# Patient Record
Sex: Male | Born: 1975 | Race: White | Hispanic: Yes | Marital: Single | Smoking: Former smoker
Health system: Southern US, Community
[De-identification: ages and names within clinical notes are randomized; demographics above are authoritative.]

## PROBLEM LIST (undated history)

## (undated) DIAGNOSIS — J039 Acute tonsillitis, unspecified: Secondary | ICD-10-CM

## (undated) DIAGNOSIS — N200 Calculus of kidney: Secondary | ICD-10-CM

---

## 1999-11-22 ENCOUNTER — Emergency Department (HOSPITAL_COMMUNITY): Admission: EM | Admit: 1999-11-22 | Discharge: 1999-11-22 | Payer: Self-pay | Admitting: Emergency Medicine

## 1999-12-03 ENCOUNTER — Emergency Department (HOSPITAL_COMMUNITY): Admission: EM | Admit: 1999-12-03 | Discharge: 1999-12-03 | Payer: Self-pay | Admitting: Emergency Medicine

## 2004-11-28 ENCOUNTER — Emergency Department (HOSPITAL_COMMUNITY): Admission: EM | Admit: 2004-11-28 | Discharge: 2004-11-28 | Payer: Self-pay | Admitting: Emergency Medicine

## 2006-03-16 ENCOUNTER — Emergency Department (HOSPITAL_COMMUNITY): Admission: EM | Admit: 2006-03-16 | Discharge: 2006-03-16 | Payer: Self-pay | Admitting: Family Medicine

## 2006-05-03 ENCOUNTER — Emergency Department (HOSPITAL_COMMUNITY): Admission: EM | Admit: 2006-05-03 | Discharge: 2006-05-03 | Payer: Self-pay | Admitting: Family Medicine

## 2006-05-26 ENCOUNTER — Emergency Department (HOSPITAL_COMMUNITY): Admission: EM | Admit: 2006-05-26 | Discharge: 2006-05-26 | Payer: Self-pay | Admitting: Family Medicine

## 2006-07-12 ENCOUNTER — Emergency Department (HOSPITAL_COMMUNITY): Admission: EM | Admit: 2006-07-12 | Discharge: 2006-07-12 | Payer: Self-pay | Admitting: Emergency Medicine

## 2008-05-18 ENCOUNTER — Emergency Department (HOSPITAL_COMMUNITY): Admission: EM | Admit: 2008-05-18 | Discharge: 2008-05-18 | Payer: Self-pay | Admitting: Emergency Medicine

## 2008-07-04 ENCOUNTER — Emergency Department (HOSPITAL_COMMUNITY): Admission: EM | Admit: 2008-07-04 | Discharge: 2008-07-05 | Payer: Self-pay | Admitting: Emergency Medicine

## 2008-11-29 ENCOUNTER — Emergency Department (HOSPITAL_COMMUNITY): Admission: EM | Admit: 2008-11-29 | Discharge: 2008-11-29 | Payer: Self-pay | Admitting: Family Medicine

## 2010-06-19 LAB — GC/CHLAMYDIA PROBE AMP, GENITAL: Chlamydia, DNA Probe: NEGATIVE

## 2010-06-24 LAB — DIFFERENTIAL
Basophils Absolute: 0.1 10*3/uL (ref 0.0–0.1)
Basophils Relative: 1 % (ref 0–1)
Eosinophils Absolute: 0.5 10*3/uL (ref 0.0–0.7)
Eosinophils Relative: 7 % — ABNORMAL HIGH (ref 0–5)
Neutrophils Relative %: 62 % (ref 43–77)

## 2010-06-24 LAB — COMPREHENSIVE METABOLIC PANEL
BUN: 10 mg/dL (ref 6–23)
CO2: 22 mEq/L (ref 19–32)
Calcium: 9.7 mg/dL (ref 8.4–10.5)
Chloride: 108 mEq/L (ref 96–112)
GFR calc Af Amer: >60 mL/min (ref >60)
GFR calc non Af Amer: >60 mL/min (ref >60)
Glucose, Bld: 88 mg/dL (ref 70–99)
Total Protein: 7 g/dL (ref 6.0–8.3)

## 2010-06-24 LAB — CBC
HCT: 47.8 % (ref 39.0–52.0)
Hemoglobin: 16.3 g/dL (ref 13.0–17.0)
MCHC: 34.1 g/dL (ref 30.0–36.0)
MCV: 86.4 fL (ref 78.0–100.0)
RBC: 5.53 MIL/uL (ref 4.22–5.81)
RDW: 14 % (ref 11.5–15.5)

## 2010-06-24 LAB — URINALYSIS, ROUTINE W REFLEX MICROSCOPIC
Ketones, ur: NEGATIVE mg/dL
Specific Gravity, Urine: 1.014 (ref 1.005–1.030)

## 2010-06-24 LAB — ACETAMINOPHEN LEVEL
Acetaminophen (Tylenol), Serum: 120.8 ug/mL — ABNORMAL HIGH (ref 10–30)
Acetaminophen (Tylenol), Serum: 78.5 ug/mL — ABNORMAL HIGH (ref 10–30)

## 2010-06-24 LAB — SALICYLATE LEVEL: Salicylate Lvl: <4 mg/dL (ref 2.8–20.0)

## 2010-06-24 LAB — RAPID URINE DRUG SCREEN, HOSP PERFORMED
Amphetamines: NOT DETECTED
Barbiturates: NOT DETECTED

## 2010-06-24 LAB — APTT: aPTT: 36 seconds (ref 24–37)

## 2010-06-24 LAB — ETHANOL: Alcohol, Ethyl (B): 40 mg/dL — ABNORMAL HIGH (ref 0–10)

## 2020-08-23 ENCOUNTER — Encounter (HOSPITAL_COMMUNITY): Payer: Self-pay | Admitting: Emergency Medicine

## 2020-08-23 ENCOUNTER — Ambulatory Visit (HOSPITAL_COMMUNITY)
Admission: EM | Admit: 2020-08-23 | Discharge: 2020-08-23 | Disposition: A | Discharge status: Home / Self Care | Payer: BC Managed Care – PPO | Attending: Medical Oncology | Admitting: Medical Oncology

## 2020-08-23 DIAGNOSIS — M549 Dorsalgia, unspecified: Secondary | ICD-10-CM | POA: Diagnosis present

## 2020-08-23 DIAGNOSIS — R310 Gross hematuria: Secondary | ICD-10-CM | POA: Diagnosis present

## 2020-08-23 LAB — POCT URINALYSIS DIPSTICK, ED / UC
Bilirubin Urine: NEGATIVE
Glucose, UA: NEGATIVE mg/dL
Ketones, ur: NEGATIVE mg/dL
Nitrite: NEGATIVE
Protein, ur: 100 mg/dL — AB
Specific Gravity, Urine: 1.01 (ref 1.005–1.030)
Urobilinogen, UA: 0.2 mg/dL (ref 0.0–1.0)
pH: 7 (ref 5.0–8.0)

## 2020-08-23 MED ORDER — TAMSULOSIN HCL 0.4 MG PO CAPS: 0.4000 mg | ORAL_CAPSULE | Freq: Every day | ORAL | 0 refills | Status: AC

## 2020-08-23 NOTE — ED Provider Notes (Signed)
[facility]    CSN: [id number] Arrival date & time: 08/23/20  1302      History   Chief Complaint Chief Complaint  Patient presents with   Hematuria    HPI MAURION WALKOWIAK is a 45 y.o. male.   HPI  Hematuria: Pt reports that he has had hematuria for the past 1 day.  He has had a little bit of right flank to back pain in addition to a little bit of urinary urgency and pressure.  He is not having any trouble with urine stream at the moment.  He denies any vomiting, abdominal pain, fever.  No history of UTI or kidney stones.  He has tried hydration for symptoms with some relief. NO history of bleeding disorder or anticoagulation use.   History reviewed. No pertinent past medical history.  There are no problems to display for this patient.   History reviewed. No pertinent surgical history.   Home Medications    Prior to Admission medications   Not on File    Family History History reviewed. No pertinent family history.  Social History     Allergies   Patient has no allergy information on record.   Review of Systems Review of Systems  As stated above in HPI Physical Exam Triage Vital Signs ED Triage Vitals  Enc Vitals Group     BP 08/23/20 1317 (!) 176/100     Pulse Rate 08/23/20 1317 (!) 55     Resp 08/23/20 1317 16     Temp 08/23/20 1317 98.6 F (37 C)     Temp Source 08/23/20 1317 Oral     SpO2 08/23/20 1317 100 %     Weight --      Height --      Head Circumference --      Peak Flow --      Pain Score 08/23/20 1319 0     Pain Loc --      Pain Edu? --      Excl. in GC? --    No data found.  Updated Vital Signs BP (!) 176/100 (BP Location: Right Arm)   Pulse (!) 55   Temp 98.6 F (37 C) (Oral)   Resp 16   SpO2 100%    Physical Exam Vitals and nursing note reviewed.  Constitutional:      General: He is not in acute distress.    Appearance: Normal appearance. He is not ill-appearing, toxic-appearing or diaphoretic.  HENT:      Head: Normocephalic and atraumatic.  Eyes:     Comments: NO pallor  Cardiovascular:     Rate and Rhythm: Normal rate and regular rhythm.     Heart sounds: Normal heart sounds.  Pulmonary:     Effort: Pulmonary effort is normal.     Breath sounds: Normal breath sounds.  Abdominal:     General: Bowel sounds are normal. There is no distension.     Palpations: Abdomen is soft. There is no mass.     Tenderness: There is no abdominal tenderness. There is no right CVA tenderness, left CVA tenderness, guarding or rebound.     Hernia: No hernia is present.  Musculoskeletal:     Cervical back: Neck supple.  Lymphadenopathy:     Cervical: No cervical adenopathy.  Skin:    General: Skin is warm.  Neurological:     Mental Status: He is alert and oriented to person, place, and time.     UC Treatments /  Results  Labs (all labs ordered are listed, but only abnormal results are displayed) Labs Reviewed - No data to display  EKG   Radiology No results found.  Procedures Procedures (including critical care time)  Medications Ordered in UC Medications - No data to display  Initial Impression / Assessment and Plan / UC Course  I have reviewed the triage vital signs and the nursing notes.  Pertinent labs & imaging results that were available during my care of the patient were reviewed by me and considered in my medical decision making (see chart for details).    New.  Likely nephrolithiasis which I discussed with patient.  For now starting on Flomax and having him hydrate with water.  Discussed KUB which she declines at this time.  We also discussed ultrasound in the ER should symptoms worsen or fail to resolve.  We discussed that the Flomax medication helps to open up the tubes to allow kidney stones to pass more easily.  Symptoms can improve within a few hours to 7 days.  We discussed red flag signs and symptoms that would warrant for him to go to the emergency room or urology.  In the  meantime he will also increase his iron intake. Final Clinical Impressions(s) / UC Diagnoses   Final diagnoses:  None   Discharge Instructions   None    ED Prescriptions   None    PDMP not reviewed this encounter.   Rushie Chestnut, [place] 08/23/20 1408

## 2020-08-23 NOTE — ED Triage Notes (Addendum)
Pt said yesterday had blood in urine with no pain. No urination issue. Did have back pain.

## 2020-08-24 LAB — URINE CULTURE: Culture: NO GROWTH

## 2021-05-13 ENCOUNTER — Ambulatory Visit (INDEPENDENT_AMBULATORY_CARE_PROVIDER_SITE_OTHER): Payer: BC Managed Care – PPO

## 2021-05-13 ENCOUNTER — Encounter (HOSPITAL_COMMUNITY): Payer: Self-pay | Admitting: Physician Assistant

## 2021-05-13 ENCOUNTER — Ambulatory Visit (HOSPITAL_COMMUNITY)
Admission: EM | Admit: 2021-05-13 | Discharge: 2021-05-13 | Disposition: A | Discharge status: Home / Self Care | Payer: BC Managed Care – PPO | Attending: Physician Assistant | Admitting: Physician Assistant

## 2021-05-13 ENCOUNTER — Other Ambulatory Visit: Payer: Self-pay

## 2021-05-13 DIAGNOSIS — M542 Cervicalgia: Secondary | ICD-10-CM

## 2021-05-13 DIAGNOSIS — M545 Low back pain, unspecified: Secondary | ICD-10-CM

## 2021-05-13 MED ORDER — METHOCARBAMOL 500 MG PO TABS: 500.0000 mg | ORAL_TABLET | Freq: Two times a day (BID) | ORAL | 0 refills | Status: AC

## 2021-05-13 MED ORDER — PREDNISONE 20 MG PO TABS: 40.0000 mg | ORAL_TABLET | Freq: Every day | ORAL | 0 refills | Status: AC
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
## 2021-06-29 ENCOUNTER — Ambulatory Visit: Payer: BC Managed Care – PPO | Admitting: Internal Medicine

## 2021-09-14 ENCOUNTER — Ambulatory Visit (HOSPITAL_COMMUNITY)
Admission: EM | Admit: 2021-09-14 | Discharge: 2021-09-14 | Disposition: A | Discharge status: Home / Self Care | Payer: 59 | Attending: Physician Assistant | Admitting: Physician Assistant

## 2021-09-14 ENCOUNTER — Encounter (HOSPITAL_COMMUNITY): Payer: Self-pay | Admitting: *Deleted

## 2021-09-14 DIAGNOSIS — J020 Streptococcal pharyngitis: Secondary | ICD-10-CM | POA: Insufficient documentation

## 2021-09-14 DIAGNOSIS — R509 Fever, unspecified: Secondary | ICD-10-CM | POA: Diagnosis not present

## 2021-09-14 HISTORY — DX: Acute tonsillitis, unspecified: J03.90

## 2021-09-14 HISTORY — DX: Calculus of kidney: N20.0

## 2021-09-14 LAB — POCT RAPID STREP A, ED / UC: Streptococcus, Group A Screen (Direct): NEGATIVE

## 2021-09-14 NOTE — ED Triage Notes (Signed)
Reports having a virtual doctor's visit yesterday and was given Augmentin Rx -- pt feels the abx doesn't work for him. C/O chills, fevers up to 102, sore throat onset 3 days ago. Describes tonsillar exudate.

## 2021-09-14 NOTE — Discharge Instructions (Addendum)
Advised to continue the Augmentin until completed. Use frequent salt water gargles and lozenges to help soothe the sore throat. Advised to use Tylenol or Motrin to help reduce the fever and the pain. Follow-up with PCP or return to urgent care if symptoms fail to improve.

## 2021-09-14 NOTE — ED Provider Notes (Signed)
[facility]    CSN: [id number] Arrival date & time: 09/14/21  1039      History   Chief Complaint Chief Complaint  Patient presents with   Sore Throat    HPI Ricky Knight is a 46 y.o. male.   46 year old male presents with sore throat and fever.  Patient relates for the past 2 days he has been having progressive sore throat, painful swallowing.  Patient indicates he has had fever off and on which has ranged anywhere from 99-102.  Patient relates that the right side of his throat started getting red and irritated and then yesterday he noticed the left side become red and irritated with white patches.  Patient indicates he did do a virtual visit with this family doctor and he was prescribed Augmentin yesterday.  The patient has taken 3 doses of the Augmentin but he has not improved.  Patient relates having fatigue, chills, body aches and pain.  Patient relates just not feeling well.  Patient indicates he has not been around any family or friends that have had strep or been sick.  Patient is tolerating fluids well, no nausea or vomiting.   Sore Throat    Past Medical History:  Diagnosis Date   Renal calculi    Tonsillitis     There are no problems to display for this patient.   History reviewed. No pertinent surgical history.     Home Medications    Prior to Admission medications   Medication Sig Start Date End Date Taking? Authorizing Provider  amoxicillin-clavulanate (AUGMENTIN) 875-125 MG tablet Take 1 tablet by mouth 2 (two) times daily.   Yes [provider]  IBUPROFEN PO Take by mouth.   Yes [provider]  methocarbamol (ROBAXIN) 500 MG tablet Take 1 tablet (500 mg total) by mouth 2 (two) times daily. 05/13/21   Tomi Bamberger, PA-C  tamsulosin (FLOMAX) 0.4 MG CAPS capsule Take 1 capsule (0.4 mg total) by mouth daily after supper. 08/23/20   Rushie Chestnut, PA-C    Family History Family History  Problem Relation Age of Onset    Fibromyalgia Mother     Social History Social History   Tobacco Use   Smoking status: Former    Types: Cigarettes   Smokeless tobacco: Never  Vaping Use   Vaping Use: Never used  Substance Use Topics   Alcohol use: Yes    Comment: socially   Drug use: Not Currently    Types: Marijuana     Allergies   Patient has no known allergies.   Review of Systems Review of Systems  Constitutional:  Positive for fatigue and fever.  HENT:  Positive for sore throat.      Physical Exam Triage Vital Signs ED Triage Vitals  Enc Vitals Group     BP 09/14/21 1122 133/76     Pulse Rate 09/14/21 1122 93     Resp 09/14/21 1122 16     Temp 09/14/21 1122 98.3 F (36.8 C)     Temp Source 09/14/21 1122 Oral     SpO2 09/14/21 1122 95 %     Weight --      Height --      Head Circumference --      Peak Flow --      Pain Score 09/14/21 1123 8     Pain Loc --      Pain Edu? --      Excl. in GC? --  No data found.  Updated Vital Signs BP 133/76   Pulse 93   Temp 98.3 F (36.8 C) (Oral)   Resp 16   SpO2 95%   Visual Acuity Right Eye Distance:   Left Eye Distance:   Bilateral Distance:    Right Eye Near:   Left Eye Near:    Bilateral Near:     Physical Exam Constitutional:      Appearance: He is well-developed.  HENT:     Right Ear: Tympanic membrane and ear canal normal.     Left Ear: Tympanic membrane and ear canal normal.     Mouth/Throat:     Mouth: Mucous membranes are moist.     Pharynx: Uvula midline. Posterior oropharyngeal erythema present. No pharyngeal swelling or oropharyngeal exudate.     Tonsils: Tonsillar exudate present.  Neck:     Comments: Neck: Moderate anterior cervical adenopathy present bilaterally with left worse than right. Cardiovascular:     Rate and Rhythm: Normal rate and regular rhythm.     Heart sounds: Normal heart sounds.  Pulmonary:     Effort: Pulmonary effort is normal.     Breath sounds: Normal air entry. No wheezing, rhonchi  or rales.  Neurological:     Mental Status: He is alert.      UC Treatments / Results  Labs (all labs ordered are listed, but only abnormal results are displayed) Labs Reviewed  CULTURE, GROUP A STREP [facility])  POCT RAPID STREP A, ED / UC    EKG   Radiology No results found.  Procedures Procedures (including critical care time)  Medications Ordered in UC Medications - No data to display  Initial Impression / Assessment and Plan / UC Course  I have reviewed the triage vital signs and the nursing notes.  Pertinent labs & imaging results that were available during my care of the patient were reviewed by me and considered in my medical decision making (see chart for details).    Plan: 1.  Advised to continue taking the Augmentin until completed to treat the strep throat. 2.  Advised Epsom salt gargles frequently and lozenges to help soothe the pain of the sore throat. 3.  Advised to follow-up with PCP or return to urgent care if symptoms fail to improve. Final Clinical Impressions(s) / UC Diagnoses   Final diagnoses:  Strep throat  Fever, unspecified     Discharge Instructions      Advised to continue the Augmentin until completed. Use frequent salt water gargles and lozenges to help soothe the sore throat. Advised to use Tylenol or Motrin to help reduce the fever and the pain. Follow-up with PCP or return to urgent care if symptoms fail to improve.    ED Prescriptions   None    PDMP not reviewed this encounter.   Ellsworth Lennox, PA-C 09/14/21 1204

## 2021-09-16 LAB — CULTURE, GROUP A STREP (THRC)

## 2022-02-02 ENCOUNTER — Ambulatory Visit (HOSPITAL_COMMUNITY)
Admission: EM | Admit: 2022-02-02 | Discharge: 2022-02-02 | Disposition: A | Discharge status: Home / Self Care | Payer: 59

## 2022-02-02 ENCOUNTER — Encounter (HOSPITAL_COMMUNITY): Payer: Self-pay | Admitting: Emergency Medicine

## 2022-02-02 DIAGNOSIS — J069 Acute upper respiratory infection, unspecified: Secondary | ICD-10-CM | POA: Diagnosis not present

## 2022-02-02 DIAGNOSIS — J029 Acute pharyngitis, unspecified: Secondary | ICD-10-CM

## 2022-02-02 NOTE — ED Provider Notes (Signed)
[name]    CSN: [id number] Arrival date & time: 02/02/22  0820      History   Chief Complaint Chief Complaint  Patient presents with   Cough   Nasal Congestion    HPI Ricky Knight is a 46 y.o. male.   Patient presents urgent care for evaluation of nasal congestion, sore throat, and mild dry cough that he has had for the last week.  Reports voice hoarseness that started couple of days ago.  He believes that this is a viral illness but his wife wanted him to come to urgent care to get checked out because she thinks he has strep throat.  Patient has not had any fever or chills, denies nausea, vomiting, dizziness, abdominal pain, body aches, malaise, chest pain, shortness of breath, and weakness.  He is a current everyday marijuana smoker but denies other drug use/cigarette use.  He does not have a history of chronic respiratory problems and has been vaccinated against COVID-19.  He has been taking Tylenol and NyQuil at home to help with symptoms and states that this has been helping greatly.  Denies facial pain, headache, and blurry vision.    Cough   Past Medical History:  Diagnosis Date   Renal calculi    Tonsillitis     There are no problems to display for this patient.   History reviewed. No pertinent surgical history.     Home Medications    Prior to Admission medications   Medication Sig Start Date End Date Taking? Authorizing Provider  amoxicillin-clavulanate (AUGMENTIN) 875-125 MG tablet Take 1 tablet by mouth 2 (two) times daily.    [provider]  IBUPROFEN PO Take by mouth.    [provider]  methocarbamol (ROBAXIN) 500 MG tablet Take 1 tablet (500 mg total) by mouth 2 (two) times daily. 05/13/21   Francene Finders, PA-C  tamsulosin (FLOMAX) 0.4 MG CAPS capsule Take 1 capsule (0.4 mg total) by mouth daily after supper. 08/23/20   Hughie Closs, PA-C    Family History Family History  Problem Relation Age of Onset    Fibromyalgia Mother     Social History Social History   Tobacco Use   Smoking status: Former    Types: Cigarettes   Smokeless tobacco: Never  Vaping Use   Vaping Use: Never used  Substance Use Topics   Alcohol use: Yes    Comment: socially   Drug use: Not Currently    Types: Marijuana     Allergies   Patient has no known allergies.   Review of Systems Review of Systems  Respiratory:  Positive for cough.   Per HPI   Physical Exam Triage Vital Signs ED Triage Vitals  Enc Vitals Group     BP 02/02/22 0851 (!) 153/89     Pulse Rate 02/02/22 0851 70     Resp 02/02/22 0851 16     Temp 02/02/22 0851 98 F (36.7 C)     Temp Source 02/02/22 0851 Oral     SpO2 02/02/22 0851 99 %     Weight --      Height --      Head Circumference --      Peak Flow --      Pain Score 02/02/22 0850 0     Pain Loc --      Pain Edu? --      Excl. in [place]? --    No data found.  Updated Vital Signs  BP (!) 153/89 (BP Location: Left Arm)   Pulse 70   Temp 98 F (36.7 C) (Oral)   Resp 16   SpO2 99%   Visual Acuity Right Eye Distance:   Left Eye Distance:   Bilateral Distance:    Right Eye Near:   Left Eye Near:    Bilateral Near:     Physical Exam Vitals and nursing note reviewed.  Constitutional:      Appearance: Normal appearance. He is not ill-appearing or toxic-appearing.  HENT:     Head: Normocephalic and atraumatic.     Right Ear: Hearing, tympanic membrane, ear canal and external ear normal.     Left Ear: Hearing, tympanic membrane, ear canal and external ear normal.     Nose: Rhinorrhea present.     Mouth/Throat:     Lips: Pink.     Mouth: Mucous membranes are moist.     Palate: No mass and lesions.     Pharynx: No posterior oropharyngeal erythema.     Tonsils: No tonsillar exudate or tonsillar abscesses.     Comments: Small amount of clear postnasal drainage visualized to the posterior oropharynx.  Phonation is normal. Eyes:     General: Lids are normal.  Vision grossly intact. Gaze aligned appropriately.     Extraocular Movements: Extraocular movements intact.     Conjunctiva/sclera: Conjunctivae normal.  Cardiovascular:     Rate and Rhythm: Normal rate and regular rhythm.     Heart sounds: Normal heart sounds, S1 normal and S2 normal.  Pulmonary:     Effort: Pulmonary effort is normal. No respiratory distress.     Breath sounds: Normal breath sounds and air entry.  Abdominal:     General: Bowel sounds are normal.     Palpations: Abdomen is soft.     Tenderness: There is no abdominal tenderness. There is no right CVA tenderness, left CVA tenderness or guarding.  Musculoskeletal:     Cervical back: Neck supple.  Lymphadenopathy:     Cervical: No cervical adenopathy.  Skin:    General: Skin is warm and dry.     Capillary Refill: Capillary refill takes less than 2 seconds.     Findings: No rash.  Neurological:     General: No focal deficit present.     Mental Status: He is alert and oriented to person, place, and time. Mental status is at baseline.     Cranial Nerves: No dysarthria or facial asymmetry.  Psychiatric:        Mood and Affect: Mood normal.        Speech: Speech normal.        Behavior: Behavior normal.        Thought Content: Thought content normal.        Judgment: Judgment normal.      UC Treatments / Results  Labs (all labs ordered are listed, but only abnormal results are displayed) Labs Reviewed - No data to display  EKG   Radiology No results found.  Procedures Procedures (including critical care time)  Medications Ordered in UC Medications - No data to display  Initial Impression / Assessment and Plan / UC Course  I have reviewed the triage vital signs and the nursing notes.  Pertinent labs & imaging results that were available during my care of the patient were reviewed by me and considered in my medical decision making (see chart for details).   1.  Viral URI with cough Presentation is  consistent with viral URI  with cough.  No concern for peritonsillar abscess since there is no tonsillar exudate or tonsillar swelling.  There is a small amount of clear postnasal drainage that is likely causing cough.  Offered Tessalon Perles prescription and patient declined stating his cough is "not that bad".  We will manage this with supportive care prescriptions for symptomatic relief.  Patient may purchase Mucinex, Tylenol, and ibuprofen over-the-counter.  He may also use Zyrtec to dry up some of the postnasal drainage causing his cough.  Increase fluid intake to stay well-hydrated recommended. Deferred viral testing as this will not change plan of care. Patient left without receiving AVS.   Discussed physical exam and available lab work findings in clinic with patient.  Counseled patient regarding appropriate use of medications and potential side effects for all medications recommended or prescribed today. Discussed red flag signs and symptoms of worsening condition,when to call the PCP office, return to urgent care, and when to seek higher level of care in the emergency department. Patient verbalizes understanding and agreement with plan. All questions answered. Patient discharged in stable condition.    Final Clinical Impressions(s) / UC Diagnoses   Final diagnoses:  Viral URI with cough  Sore throat   Discharge Instructions   None    ED Prescriptions   None    PDMP not reviewed this encounter.   Reita May Lloyd, [place] 02/02/22 [phone]

## 2022-02-02 NOTE — ED Triage Notes (Signed)
Pt reports that he had strep throat 6 weeks ago and then got better. Report today hoarse voice, coughing up phlegm, congestion and sore throat. Adds that lips were dry and chapped and think related to having a fever. Pt also Reports that smoked pot while sick so unsure if that affected anything.
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
## 2022-09-26 ENCOUNTER — Ambulatory Visit (HOSPITAL_COMMUNITY)
Admission: EM | Admit: 2022-09-26 | Discharge: 2022-09-26 | Disposition: A | Discharge status: Home / Self Care | Payer: 59

## 2022-09-26 ENCOUNTER — Encounter (HOSPITAL_COMMUNITY): Payer: Self-pay | Admitting: *Deleted

## 2022-09-26 DIAGNOSIS — L255 Unspecified contact dermatitis due to plants, except food: Secondary | ICD-10-CM | POA: Diagnosis not present

## 2022-09-26 DIAGNOSIS — R21 Rash and other nonspecific skin eruption: Secondary | ICD-10-CM | POA: Diagnosis not present

## 2022-09-26 DIAGNOSIS — R030 Elevated blood-pressure reading, without diagnosis of hypertension: Secondary | ICD-10-CM

## 2022-09-26 MED ORDER — METHYLPREDNISOLONE 4 MG PO TBPK: ORAL_TABLET | ORAL | 0 refills | Status: AC

## 2022-09-26 NOTE — Discharge Instructions (Addendum)
Please get established with PCP of your choice, take steroid as directed.May use over the counter allergy med of choice(zyrtec,claritin, allegra), may use calamine lotion as label directed. Avoid heat, hot water as it makes rashes worse. Follow up with PCP. Go to ER for new or worsening issues or concerns.

## 2022-09-26 NOTE — ED Triage Notes (Signed)
Pt states he had a cold about 2 weeks ago and he has been breathing "weird" since then. He denies any SOB.   He also has a rash that has not spread to his penis. He did a evisit and was given TAC but its not working. He assumes he has poison ivy. He is itchy and has the rash on most of his body.

## 2022-09-26 NOTE — ED Provider Notes (Signed)
[facility]    CSN: [id number] Arrival date & time: 09/26/22  1001      History   Chief Complaint Chief Complaint  Patient presents with   Rash    HPI Ricky Knight is a 47 y.o. male.   47 year old male pt, Ricky Knight, presents to urgent care for generalized rash, recent outdoor plant exposure. Pt has tried cool showers, tried topical cream without relief.   The history is provided by the patient. No language interpreter was used.    Past Medical History:  Diagnosis Date   Renal calculi    Tonsillitis     Patient Active Problem List   Diagnosis Date Noted   Contact dermatitis due to plant 09/26/2022   Rash and nonspecific skin eruption 09/26/2022   Elevated blood pressure reading 09/26/2022    History reviewed. No pertinent surgical history.     Home Medications    Prior to Admission medications   Medication Sig Start Date End Date Taking? Authorizing Provider  methylPREDNISolone (MEDROL DOSEPAK) 4 MG TBPK tablet Take as package directed,taper dose 09/26/22  Yes Timberly Yott, Para March, NP  triamcinolone cream (KENALOG) 0.1 % Apply 1 Application topically 2 (two) times daily.   Yes [provider]  amoxicillin-clavulanate (AUGMENTIN) 875-125 MG tablet Take 1 tablet by mouth 2 (two) times daily.    [provider]  IBUPROFEN PO Take by mouth.    [provider]  methocarbamol (ROBAXIN) 500 MG tablet Take 1 tablet (500 mg total) by mouth 2 (two) times daily. 05/13/21   Tomi Bamberger, PA-C  tamsulosin (FLOMAX) 0.4 MG CAPS capsule Take 1 capsule (0.4 mg total) by mouth daily after supper. 08/23/20   Rushie Chestnut, PA-C    Family History Family History  Problem Relation Age of Onset   Fibromyalgia Mother     Social History Social History   Tobacco Use   Smoking status: Former    Types: Cigarettes   Smokeless tobacco: Never  Vaping Use   Vaping status: Never Used  Substance Use Topics   Alcohol use: Yes     Comment: socially   Drug use: Not Currently    Types: Marijuana     Allergies   Patient has no known allergies.   Review of Systems Review of Systems  Constitutional:  Negative for fever.  Respiratory:  Negative for cough, shortness of breath and wheezing.   Skin:  Positive for rash.  All other systems reviewed and are negative.    Physical Exam Triage Vital Signs ED Triage Vitals  Encounter Vitals Group     BP      Systolic BP Percentile      Diastolic BP Percentile      Pulse      Resp      Temp      Temp src      SpO2      Weight      Height      Head Circumference      Peak Flow      Pain Score      Pain Loc      Pain Education      Exclude from Growth Chart    No data found.  Updated Vital Signs BP (!) 173/63 (BP Location: Left Arm)   Pulse 68   Temp 97.9 F (36.6 C) (Oral)   Resp 18   SpO2 97%   Visual Acuity Right Eye Distance:   Left Eye  Distance:   Bilateral Distance:    Right Eye Near:   Left Eye Near:    Bilateral Near:     Physical Exam Vitals and nursing note reviewed. Exam conducted with a chaperone present.  Constitutional:      General: He is not in acute distress.    Appearance: He is well-developed and well-groomed.  HENT:     Head: Normocephalic and atraumatic.  Eyes:     Conjunctiva/sclera: Conjunctivae normal.  Cardiovascular:     Rate and Rhythm: Normal rate and regular rhythm.     Pulses: Normal pulses.     Heart sounds: Normal heart sounds. No murmur heard. Pulmonary:     Effort: Pulmonary effort is normal. No respiratory distress.     Breath sounds: Normal breath sounds and air entry.  Abdominal:     Palpations: Abdomen is soft.     Tenderness: There is no abdominal tenderness.  Genitourinary:    Penis: Circumcised.   Musculoskeletal:        General: No swelling.     Cervical back: Neck supple.  Skin:    General: Skin is warm and dry.     Capillary Refill: Capillary refill takes less than 2 seconds.      Findings: Rash present. Rash is vesicular.     Comments: Linear rash to bilateral upper extremities, neck,groin c/w contact dermatitis from plant  Neurological:     General: No focal deficit present.     Mental Status: He is alert and oriented to person, place, and time.     GCS: GCS eye subscore is 4. GCS verbal subscore is 5. GCS motor subscore is 6.  Psychiatric:        Attention and Perception: Attention normal.        Mood and Affect: Mood normal.        Speech: Speech normal.        Behavior: Behavior normal. Behavior is cooperative.      UC Treatments / Results  Labs (all labs ordered are listed, but only abnormal results are displayed) Labs Reviewed - No data to display  EKG   Radiology No results found.  Procedures Procedures (including critical care time)  Medications Ordered in UC Medications - No data to display  Initial Impression / Assessment and Plan / UC Course  I have reviewed the triage vital signs and the nursing notes.  Pertinent labs & imaging results that were available during my care of the patient were reviewed by me and considered in my medical decision making (see chart for details).     Ddx: Contact dermatitis, rash, allergic reaction Final Clinical Impressions(s) / UC Diagnoses   Final diagnoses:  Contact dermatitis due to plant  Rash and nonspecific skin eruption  Elevated blood pressure reading     Discharge Instructions      Please get established with PCP of your choice, take steroid as directed.May use over the counter allergy med of choice(zyrtec,claritin, allegra), may use calamine lotion as label directed. Avoid heat, hot water as it makes rashes worse. Follow up with PCP. Go to ER for new or worsening issues or concerns.      ED Prescriptions     Medication Sig Dispense Auth. Provider   methylPREDNISolone (MEDROL DOSEPAK) 4 MG TBPK tablet Take as package directed,taper dose 21 tablet Benjie Ricketson, Para March, NP      PDMP  not reviewed this encounter.   Clancy Gourd, NP 09/26/22 [phone]
